# Patient Record
Sex: Female | Born: 1935 | Race: White | Hispanic: No | Marital: Married | Smoking: Former smoker
Health system: Southern US, Community
[De-identification: ages and names within clinical notes are randomized; demographics above are authoritative.]

## PROBLEM LIST (undated history)

## (undated) DIAGNOSIS — I509 Heart failure, unspecified: Secondary | ICD-10-CM

## (undated) DIAGNOSIS — J449 Chronic obstructive pulmonary disease, unspecified: Secondary | ICD-10-CM

## (undated) DIAGNOSIS — I2510 Atherosclerotic heart disease of native coronary artery without angina pectoris: Secondary | ICD-10-CM

## (undated) DIAGNOSIS — E785 Hyperlipidemia, unspecified: Secondary | ICD-10-CM

## (undated) DIAGNOSIS — I729 Aneurysm of unspecified site: Secondary | ICD-10-CM

## (undated) HISTORY — DX: Heart failure, unspecified: I50.9

## (undated) HISTORY — DX: Atherosclerotic heart disease of native coronary artery without angina pectoris: I25.10

## (undated) HISTORY — DX: Hyperlipidemia, unspecified: E78.5

## (undated) HISTORY — DX: Aneurysm of unspecified site: I72.9

## (undated) HISTORY — DX: Chronic obstructive pulmonary disease, unspecified: J44.9

---

## 2015-12-04 DIAGNOSIS — I714 Abdominal aortic aneurysm, without rupture, unspecified: Secondary | ICD-10-CM | POA: Insufficient documentation

## 2015-12-04 DIAGNOSIS — Z4502 Encounter for adjustment and management of automatic implantable cardiac defibrillator: Secondary | ICD-10-CM | POA: Insufficient documentation

## 2015-12-04 DIAGNOSIS — I2510 Atherosclerotic heart disease of native coronary artery without angina pectoris: Secondary | ICD-10-CM | POA: Insufficient documentation

## 2015-12-04 DIAGNOSIS — F17200 Nicotine dependence, unspecified, uncomplicated: Secondary | ICD-10-CM | POA: Insufficient documentation

## 2015-12-04 DIAGNOSIS — I255 Ischemic cardiomyopathy: Secondary | ICD-10-CM | POA: Insufficient documentation

## 2016-08-06 DIAGNOSIS — E785 Hyperlipidemia, unspecified: Secondary | ICD-10-CM | POA: Insufficient documentation

## 2016-08-06 DIAGNOSIS — T82330A Leakage of aortic (bifurcation) graft (replacement), initial encounter: Secondary | ICD-10-CM | POA: Insufficient documentation

## 2016-08-06 DIAGNOSIS — IMO0002 Reserved for concepts with insufficient information to code with codable children: Secondary | ICD-10-CM | POA: Insufficient documentation

## 2016-08-06 DIAGNOSIS — I10 Essential (primary) hypertension: Secondary | ICD-10-CM | POA: Insufficient documentation

## 2016-09-12 DIAGNOSIS — J449 Chronic obstructive pulmonary disease, unspecified: Secondary | ICD-10-CM

## 2016-09-12 DIAGNOSIS — E876 Hypokalemia: Secondary | ICD-10-CM

## 2016-09-12 DIAGNOSIS — J9601 Acute respiratory failure with hypoxia: Secondary | ICD-10-CM

## 2016-09-12 DIAGNOSIS — I10 Essential (primary) hypertension: Secondary | ICD-10-CM

## 2016-09-12 DIAGNOSIS — Z720 Tobacco use: Secondary | ICD-10-CM

## 2016-09-12 DIAGNOSIS — I5023 Acute on chronic systolic (congestive) heart failure: Secondary | ICD-10-CM

## 2016-09-13 DIAGNOSIS — E876 Hypokalemia: Secondary | ICD-10-CM | POA: Diagnosis not present

## 2016-09-13 DIAGNOSIS — J9601 Acute respiratory failure with hypoxia: Secondary | ICD-10-CM | POA: Diagnosis not present

## 2016-09-13 DIAGNOSIS — I5023 Acute on chronic systolic (congestive) heart failure: Secondary | ICD-10-CM | POA: Diagnosis not present

## 2016-09-13 DIAGNOSIS — J449 Chronic obstructive pulmonary disease, unspecified: Secondary | ICD-10-CM | POA: Diagnosis not present

## 2016-12-10 DIAGNOSIS — I361 Nonrheumatic tricuspid (valve) insufficiency: Secondary | ICD-10-CM

## 2016-12-10 DIAGNOSIS — I509 Heart failure, unspecified: Secondary | ICD-10-CM

## 2016-12-11 ENCOUNTER — Encounter: Payer: Self-pay | Admitting: Cardiology

## 2016-12-19 ENCOUNTER — Ambulatory Visit: Payer: Medicare PPO | Admitting: Cardiology

## 2016-12-23 ENCOUNTER — Encounter: Payer: Self-pay | Admitting: Cardiology

## 2016-12-23 ENCOUNTER — Ambulatory Visit (INDEPENDENT_AMBULATORY_CARE_PROVIDER_SITE_OTHER): Payer: Medicare PPO | Admitting: Cardiology

## 2016-12-23 VITALS — BP 108/68 | HR 72 | Resp 14 | Ht 67.0 in | Wt 122.0 lb

## 2016-12-23 DIAGNOSIS — I714 Abdominal aortic aneurysm, without rupture, unspecified: Secondary | ICD-10-CM

## 2016-12-23 DIAGNOSIS — F17200 Nicotine dependence, unspecified, uncomplicated: Secondary | ICD-10-CM

## 2016-12-23 DIAGNOSIS — I2510 Atherosclerotic heart disease of native coronary artery without angina pectoris: Secondary | ICD-10-CM

## 2016-12-23 DIAGNOSIS — J449 Chronic obstructive pulmonary disease, unspecified: Secondary | ICD-10-CM

## 2016-12-23 DIAGNOSIS — I255 Ischemic cardiomyopathy: Secondary | ICD-10-CM

## 2016-12-23 DIAGNOSIS — I10 Essential (primary) hypertension: Secondary | ICD-10-CM

## 2016-12-23 DIAGNOSIS — E782 Mixed hyperlipidemia: Secondary | ICD-10-CM

## 2016-12-23 NOTE — Patient Instructions (Signed)
Medication Instructions:  Your physician recommends that you continue on your current medications as directed. Please refer to the Current Medication list given to you today.  Labwork: Pro-BNP and BMP so we can see how your kidneys are doing and assess if you have any fluid build up.   Testing/Procedures: None   Follow-Up: Your physician recommends that you schedule a follow-up appointment in: 1 month with Dr. Bing MatterKrasowski   Any Other Special Instructions Will Be Listed Below (If Applicable).     If you need a refill on your cardiac medications before your next appointment, please call your pharmacy.

## 2016-12-23 NOTE — Progress Notes (Signed)
Cardiology Office Note:    Date:  12/23/2016   ID:  Yvonne BjornstadBarbara E Osei, DOB 09/04/1933, MRN [id number]  PCP:  Lonie Peakonroy, Nathan, PA-C  Cardiologist:  Gypsy Balsamobert Krasowski, MD    Referring MD: No ref. provider found   Chief Complaint  Patient presents with  . Hospitalization Follow-up  I'm short of breath tired and exhausted  History of Present Illness:    Yvonne Russell is a 83 y.o. female  with COPD as well as severe coronary myopathy. She complains of having weakness fatigue and tiredness. She is actually very frustrated. 6 months ago she said she was able to leave without oxygen and had some freedom and none she lost it all. Denies having any chest pain tightness squeezing pressure burning chest, she sleeps very well, there is no swelling of lower extremities. We had a long discussion about what to do with this situation and I will ask him to have proBNP to see if there is in the room for improvement with her CHF medications. I will also schedule her to see pulmonary to see if we can help her with her pulmonary status.  Past Medical History:  Diagnosis Date  . Aneurysm (HCC)   . COPD (chronic obstructive pulmonary disease) (HCC)   . Coronary artery disease   . Hyperlipidemia     History reviewed. No pertinent surgical history.  Current Medications: Current Meds  Medication Sig  . atorvastatin (LIPITOR) 10 MG tablet Take 1 tablet by mouth at bedtime.  . enalapril (VASOTEC) 2.5 MG tablet Take 2.5 mg by mouth 2 (two) times daily.   . furosemide (LASIX) 40 MG tablet Take 40 mg by mouth 2 (two) times daily.  . metoprolol succinate (TOPROL-XL) 25 MG 24 hr tablet Take 25 mg by mouth daily.  Marland Kitchen. omeprazole (PRILOSEC) 20 MG capsule Take 20 mg by mouth daily.  . rivaroxaban (XARELTO) 20 MG TABS tablet Take 20 mg by mouth daily with supper.  . Venlafaxine HCl 75 MG TB24 Take 1 tablet by mouth daily.     Allergies:   Patient has no known allergies.   Social History   Social History  .  Marital status: Married    Spouse name: N/A  . Number of children: N/A  . Years of education: N/A   Social History Main Topics  . Smoking status: Former Games developermoker  . Smokeless tobacco: Never Used  . Alcohol use No  . Drug use: No  . Sexual activity: Not Asked   Other Topics Concern  . None   Social History Narrative  . None     Family History: The patient's family history includes Heart failure in her mother. ROS:   Please see the history of present illness.    All 14 point review of systems negative except as described per history of present illness  EKGs/Labs/Other Studies Reviewed:      Recent Labs: No results found for requested labs within last 8760 hours.  Recent Lipid Panel No results found for: CHOL, TRIG, HDL, CHOLHDL, VLDL, LDLCALC, LDLDIRECT  Physical Exam:    VS:  BP 108/68   Pulse 72   Resp 14   Ht 5\' 7"  (1.702 m)   Wt 122 lb (55.3 kg) Comment: Reported, Wheel Chair  BMI 19.11 kg/m     Wt Readings from Last 3 Encounters:  12/23/16 122 lb (55.3 kg)     GEN: Cachectic-looking on the wheelchair with oxygen HEENT: Normal NECK: No JVD; No carotid bruits LYMPHATICS: No lymphadenopathy  CARDIAC: RRR, no murmurs, no rubs, no gallops RESPIRATORY:  Clear to auscultation without rales, wheezing or rhonchi poor entry bilaterally. ABDOMEN: Soft, non-tender, non-distended MUSCULOSKELETAL:  No edema; No deformity  SKIN: Warm and dry LOWER EXTREMITIES: no swelling NEUROLOGIC:  Alert and oriented x 3 PSYCHIATRIC:  Normal affect   ASSESSMENT:    1. Abdominal aortic aneurysm (AAA) without rupture (HCC)   2. Coronary artery disease involving native coronary artery of native heart without angina pectoris   3. Ischemic cardiomyopathy   4. Benign essential hypertension   5. Mixed hyperlipidemia   6. Smoking    PLAN:    In order of problems listed above:  1. Status post aortic aneurysm repair stable continue present management. 2. Coronary artery disease:  Stable we'll continue present management. 3. Ischemic cardiomyopathy: I will do proBNP to look for an opportunity to make her feel better. 4. Essential hypertension: Blood pressure appears to be well controlled for now we'll continue present management. 5. Dyslipidemia she is taking atorvastatin which I will continue. 6. Smoking: She tells me that she quit smoking.   Medication Adjustments/Labs and Tests Ordered: Current medicines are reviewed at length with the patient today.  Concerns regarding medicines are outlined above.  No orders of the defined types were placed in this encounter.  Medication changes: No orders of the defined types were placed in this encounter.   Signed, Georgeanna Leaobert J. Krasowski, MD, [facility] 12/23/2016 5:01 PM    [facility]

## 2016-12-24 LAB — BASIC METABOLIC PANEL
BUN / CREAT RATIO: 17 (ref 12–28)
BUN: 21 mg/dL (ref 8–27)
CALCIUM: 9.5 mg/dL (ref 8.7–10.3)
CHLORIDE: 95 mmol/L — AB (ref 96–106)
CO2: 31 mmol/L — AB (ref 20–29)
CREATININE: 1.25 mg/dL — AB (ref 0.57–1.00)
GFR, EST AFRICAN AMERICAN: 47 mL/min/{1.73_m2} — AB (ref >59)
GFR, EST NON AFRICAN AMERICAN: 40 mL/min/{1.73_m2} — AB (ref >59)
Glucose: 85 mg/dL (ref 65–99)
Potassium: 3.7 mmol/L (ref 3.5–5.2)
Sodium: 143 mmol/L (ref 134–144)

## 2016-12-24 LAB — PRO B NATRIURETIC PEPTIDE: NT-Pro BNP: 9305 pg/mL — ABNORMAL HIGH (ref 0–738)

## 2016-12-26 ENCOUNTER — Other Ambulatory Visit: Payer: Self-pay

## 2016-12-30 ENCOUNTER — Telehealth: Payer: Self-pay | Admitting: Cardiology

## 2016-12-30 NOTE — Telephone Encounter (Signed)
Pt advised another referral has been sent and gave her the number to the office of which she was referred.

## 2016-12-30 NOTE — Addendum Note (Signed)
Addended by: Rodney LangtonITTER, JADA M on: 12/30/2016 01:39 PM   Modules accepted: Orders

## 2016-12-30 NOTE — Telephone Encounter (Signed)
Was supposed to be referred to pulmonologist but still hasn't heard anything

## 2016-12-30 NOTE — Telephone Encounter (Signed)
Another referral has been sent to pulmonology this time to Dr. Carlena SaxAlvas office.

## 2017-01-23 ENCOUNTER — Ambulatory Visit: Payer: Medicare PPO | Admitting: Cardiology

## 2017-01-30 DIAGNOSIS — J449 Chronic obstructive pulmonary disease, unspecified: Secondary | ICD-10-CM

## 2017-01-30 DIAGNOSIS — E785 Hyperlipidemia, unspecified: Secondary | ICD-10-CM | POA: Diagnosis not present

## 2017-01-30 DIAGNOSIS — I10 Essential (primary) hypertension: Secondary | ICD-10-CM | POA: Diagnosis not present

## 2017-01-30 DIAGNOSIS — I509 Heart failure, unspecified: Secondary | ICD-10-CM | POA: Diagnosis not present

## 2017-01-30 DIAGNOSIS — J90 Pleural effusion, not elsewhere classified: Secondary | ICD-10-CM | POA: Diagnosis not present

## 2017-01-30 DIAGNOSIS — J189 Pneumonia, unspecified organism: Secondary | ICD-10-CM | POA: Diagnosis not present

## 2017-01-31 DIAGNOSIS — J90 Pleural effusion, not elsewhere classified: Secondary | ICD-10-CM | POA: Diagnosis not present

## 2017-01-31 DIAGNOSIS — J449 Chronic obstructive pulmonary disease, unspecified: Secondary | ICD-10-CM | POA: Diagnosis not present

## 2017-01-31 DIAGNOSIS — E785 Hyperlipidemia, unspecified: Secondary | ICD-10-CM | POA: Diagnosis not present

## 2017-01-31 DIAGNOSIS — J189 Pneumonia, unspecified organism: Secondary | ICD-10-CM | POA: Diagnosis not present

## 2017-01-31 DIAGNOSIS — I509 Heart failure, unspecified: Secondary | ICD-10-CM | POA: Diagnosis not present

## 2017-01-31 DIAGNOSIS — I10 Essential (primary) hypertension: Secondary | ICD-10-CM | POA: Diagnosis not present

## 2017-02-01 DIAGNOSIS — J90 Pleural effusion, not elsewhere classified: Secondary | ICD-10-CM | POA: Diagnosis not present

## 2017-02-01 DIAGNOSIS — J449 Chronic obstructive pulmonary disease, unspecified: Secondary | ICD-10-CM | POA: Diagnosis not present

## 2017-02-01 DIAGNOSIS — J189 Pneumonia, unspecified organism: Secondary | ICD-10-CM | POA: Diagnosis not present

## 2017-02-01 DIAGNOSIS — I509 Heart failure, unspecified: Secondary | ICD-10-CM | POA: Diagnosis not present

## 2017-02-02 DIAGNOSIS — J90 Pleural effusion, not elsewhere classified: Secondary | ICD-10-CM | POA: Diagnosis not present

## 2017-02-02 DIAGNOSIS — I509 Heart failure, unspecified: Secondary | ICD-10-CM | POA: Diagnosis not present

## 2017-02-02 DIAGNOSIS — J189 Pneumonia, unspecified organism: Secondary | ICD-10-CM | POA: Diagnosis not present

## 2017-02-02 DIAGNOSIS — J449 Chronic obstructive pulmonary disease, unspecified: Secondary | ICD-10-CM | POA: Diagnosis not present

## 2017-02-03 DIAGNOSIS — J189 Pneumonia, unspecified organism: Secondary | ICD-10-CM | POA: Diagnosis not present

## 2017-02-03 DIAGNOSIS — I509 Heart failure, unspecified: Secondary | ICD-10-CM | POA: Diagnosis not present

## 2017-02-03 DIAGNOSIS — J90 Pleural effusion, not elsewhere classified: Secondary | ICD-10-CM | POA: Diagnosis not present

## 2017-02-03 DIAGNOSIS — J449 Chronic obstructive pulmonary disease, unspecified: Secondary | ICD-10-CM | POA: Diagnosis not present

## 2017-02-04 DIAGNOSIS — J90 Pleural effusion, not elsewhere classified: Secondary | ICD-10-CM | POA: Diagnosis not present

## 2017-02-04 DIAGNOSIS — J449 Chronic obstructive pulmonary disease, unspecified: Secondary | ICD-10-CM | POA: Diagnosis not present

## 2017-02-04 DIAGNOSIS — I509 Heart failure, unspecified: Secondary | ICD-10-CM | POA: Diagnosis not present

## 2017-02-04 DIAGNOSIS — J189 Pneumonia, unspecified organism: Secondary | ICD-10-CM | POA: Diagnosis not present

## 2017-02-10 ENCOUNTER — Other Ambulatory Visit: Payer: Self-pay

## 2017-02-10 MED ORDER — FUROSEMIDE 40 MG PO TABS: 40.0000 mg | ORAL_TABLET | Freq: Two times a day (BID) | ORAL | 1 refills | Status: AC

## 2017-03-18 ENCOUNTER — Ambulatory Visit (INDEPENDENT_AMBULATORY_CARE_PROVIDER_SITE_OTHER): Payer: Medicare PPO | Admitting: Internal Medicine

## 2017-03-18 ENCOUNTER — Encounter: Payer: Self-pay | Admitting: Internal Medicine

## 2017-03-18 ENCOUNTER — Other Ambulatory Visit: Payer: Medicare PPO

## 2017-03-18 ENCOUNTER — Ambulatory Visit (INDEPENDENT_AMBULATORY_CARE_PROVIDER_SITE_OTHER)
Admission: RE | Admit: 2017-03-18 | Discharge: 2017-03-18 | Disposition: A | Discharge status: Home / Self Care | Payer: Medicare PPO | Source: Ambulatory Visit | Attending: Internal Medicine | Admitting: Internal Medicine

## 2017-03-18 VITALS — BP 110/70 | HR 109 | Ht 67.0 in | Wt 121.0 lb

## 2017-03-18 DIAGNOSIS — F17200 Nicotine dependence, unspecified, uncomplicated: Secondary | ICD-10-CM

## 2017-03-18 DIAGNOSIS — J449 Chronic obstructive pulmonary disease, unspecified: Secondary | ICD-10-CM

## 2017-03-18 DIAGNOSIS — J9611 Chronic respiratory failure with hypoxia: Secondary | ICD-10-CM | POA: Diagnosis not present

## 2017-03-18 DIAGNOSIS — J90 Pleural effusion, not elsewhere classified: Secondary | ICD-10-CM | POA: Diagnosis not present

## 2017-03-18 NOTE — Patient Instructions (Addendum)
ICD-10-CM   1. Smoking F17.200   2. Chronic obstructive pulmonary disease, unspecified COPD type (HCC) J44.9   3. Chronic respiratory failure with hypoxia (HCC) J96.11   4. Recurrent left pleural effusion J90     CXR 2 view - wil call with results to decide next step Do alpha 1 AT level for genetic cause of copd - will call with results You will need med calendar visit with APP at some point in future  Followup - based on results of cxr

## 2017-03-18 NOTE — Progress Notes (Signed)
Subjective:     Patient ID: Yvonne Russell, female   DOB: 08-03-1933, 83 y.o.   MRN: [id number]  PCP Yvonne Peak, PA-C  HPI  IOV 03/18/2017  Chief Complaint  Patient presents with  . Advice Only    pleural effusion per heartcare and nathan conroy, severe SOB walking to exam room    This 83 year old female with a past medical history of coronary artery disease,chronic systolic heart failure with ejection fraction 25% with a defibrillator chronic atrial fibrillation, abdominal aortic aneurysm status post repair, chronic tobacco use with COPD on 4 L oxygen not otherwise specified. She tells me and her granddaughter confirms this. It in the last 1 year she's had 4 admissions for respiratory exacerbations although [facility]. Starting April 2018 she started being on home oxygen for COPD. She did have a respiratory admission in early to mid September 2018. Visualization of the chest x-rays done at [facility] in September 2018 show that early in September 2018 she hadmoderate left pleural effusion that after thoracentesis is small bilateral left greater than right effusion. In review of the discharge summary at that time shows that the admission diagnosis was acute on chronic hypoxemic respiratory failure due to combination of pneumonia and acute on chronic systolic heart failure. She was treated with antibiotics and diuresis. She was discharged to a skilled nursing facility and there was a recommendation to follow up with pulmonary. Therefore she has been referred here. Chart review suggests that the effusions were transudate. She also tells me that the only thing that improved her was the thoracentesis. Stick 10 day stay at the skilled nursing facility didn't help herphysical conditioning a little bit. Overall she's not back to her baseline but she is functional. Echocardiogram done 12/09/2016 at [facility]: Reviewed the results and it showed global hypokinesis with ejection fraction  25% with biatrial enlargement and elevated right ventricular systolic pressure of pulmonary hypertension  Noted: she says she is on nebs/mdi for copd but med review does not show any other than albuterol  COPD CAT score   CAT COPD Symptom & Quality of Life Score (GSK trademark) 0 is no burden. 5 is highest burden 03/18/2017   Never Cough -> Cough all the time 2  No phlegm in chest -> Chest is full of phlegm 1  No chest tightness -> Chest feels very tight 1  No dyspnea for 1 flight stairs/hill -> Very dyspneic for 1 flight of stairs 5  No limitations for ADL at home -> Very limited with ADL at home 5  Confident leaving home -> Not at all confident leaving home 5  Sleep soundly -> Do not sleep soundly because of lung condition 4  Lots of Energy -> No energy at all 3  TOTAL Score (max 40)  26       Results for Yvonne, Russell (MRN [id number]) as of 03/18/2017 11:39  Ref. Range 12/23/2016 17:12  Creatinine Latest Ref Range: 0.57 - 1.00 mg/dL 7.82 (H)     has a past medical history of Aneurysm (HCC); CHF (congestive heart failure) (HCC); COPD (chronic obstructive pulmonary disease) (HCC); Coronary artery disease; and Hyperlipidemia.   reports that she quit smoking about 3 months ago. Her smoking use included Cigarettes. She has a 31.00 pack-year smoking history. She has never used smokeless tobacco.  History reviewed. No pertinent surgical history.  No Known Allergies  Immunization History  Administered Date(s) Administered  . Influenza, High Dose Seasonal PF 01/16/2017  . Pneumococcal Conjugate-13 03/19/2015  Family History  Problem Relation Age of Onset  . Heart failure Mother      Current Outpatient Prescriptions:  .  atorvastatin (LIPITOR) 10 MG tablet, Take 1 tablet by mouth at bedtime., Disp: , Rfl: 6 .  enalapril (VASOTEC) 2.5 MG tablet, Take 2.5 mg by mouth daily. , Disp: , Rfl:  .  furosemide (LASIX) 40 MG tablet, Take 1 tablet (40 mg total) by mouth 2 (two)  times daily., Disp: 180 tablet, Rfl: 1 .  metoprolol succinate (TOPROL-XL) 25 MG 24 hr tablet, Take 25 mg by mouth daily., Disp: , Rfl:  .  omeprazole (PRILOSEC) 20 MG capsule, Take 20 mg by mouth daily., Disp: , Rfl:  .  rivaroxaban (XARELTO) 20 MG TABS tablet, Take 20 mg by mouth daily with supper., Disp: , Rfl:     Review of Systems     Objective:   Physical Exam  Constitutional: She is oriented to person, place, and time. No distress.  lean  HENT:  Head: Normocephalic and atraumatic.  Right Ear: External ear normal.  Left Ear: External ear normal.  Mouth/Throat: Oropharynx is clear and moist. No oropharyngeal exudate.  Eyes: Pupils are equal, round, and reactive to light. Conjunctivae and EOM are normal. Right eye exhibits no discharge. Left eye exhibits no discharge. No scleral icterus.  Neck: Normal range of motion. Neck supple. No JVD present. No tracheal deviation present. No thyromegaly present.  Cardiovascular: Normal rate, regular rhythm, normal heart sounds and intact distal pulses.  Exam reveals no gallop and no friction rub.   No murmur heard. Pulmonary/Chest: Effort normal and breath sounds normal. No respiratory distress. She has no wheezes. She has no rales. She exhibits no tenderness.  o2 on Purse lip breathing Left base air entry diminished  Abdominal: Soft. Bowel sounds are normal. She exhibits no distension and no mass. There is no tenderness. There is no rebound and no guarding.  Musculoskeletal: Normal range of motion. She exhibits no edema or tenderness.  Very mild pedal edema only  Lymphadenopathy:    She has no cervical adenopathy.  Neurological: She is alert and oriented to person, place, and time. She has normal reflexes. No cranial nerve deficit. She exhibits normal muscle tone. Coordination normal.  Skin: Skin is warm and dry. No rash noted. She is not diaphoretic. No erythema. No pallor.  Psychiatric: She has a normal mood and affect. Her behavior is  normal. Judgment and thought content normal.  Vitals reviewed.   Vitals:   03/18/17 1121 03/18/17 1122 03/18/17 1123  BP:   110/70  Pulse:  (!) 109   SpO2:  97%   Weight: 121 lb (54.9 kg)    Height: 5\' 7"  (1.702 m)      Estimated body mass index is 18.95 kg/m as calculated from the following:   Height as of this encounter: 5\' 7"  (1.702 m).   Weight as of this encounter: 121 lb (54.9 kg).      Assessment:       ICD-10-CM   1. Smoking F17.200   2. Chronic obstructive pulmonary disease, unspecified COPD type (HCC) J44.9   3. Chronic respiratory failure with hypoxia (HCC) J96.11   4. Recurrent left pleural effusion J90        Plan:       CXR 2 view - wil call with results to decide next step Do alpha 1 AT level for genetic cause of copd - will call with results You will need med calendar visit with  APP at some point in future  Followup - based on results of cxr    Dr. Kalman Shan, M.D., [facility].C.P Pulmonary and Critical Care Medicine Staff Physician [facility] [place] Pulmonary and Critical Care Pager: [phone], If no answer or between  15:00h - 7:00h: call [phone]  03/18/2017 11:56 AM

## 2017-03-18 NOTE — Addendum Note (Signed)
Addended by: Wyvonne LenzPINION, Dequon Schnebly P on: 03/18/2017 11:59 AM   Modules accepted: Orders

## 2017-03-18 NOTE — Progress Notes (Signed)
   Subjective:    Patient ID: Yvonne BjornstadBarbara E Russell, female    DOB: 10/24/1933, 83 y.o.   MRN: [id number]  HPI    Review of Systems  HENT: Positive for sinus pressure, sneezing and sore throat.   Respiratory: Positive for cough and shortness of breath.   Allergic/Immunologic: Negative.   Hematological: Bruises/bleeds easily.  Psychiatric/Behavioral: Positive for dysphoric mood.       Objective:   Physical Exam        Assessment & Plan:

## 2017-03-22 LAB — ALPHA-1 ANTITRYPSIN PHENOTYPE: A1 ANTITRYPSIN SER: 224 mg/dL — AB (ref 83–199)

## 2017-03-24 ENCOUNTER — Telehealth: Payer: Self-pay | Admitting: Internal Medicine

## 2017-03-24 NOTE — Telephone Encounter (Signed)
Yvonne Russell, Murali, MD sent to Pinion, Farley LyEmily P, CMA        Alpha 1 Is MM   Spoke with the pt and notified of results of her labs  She is requesting her cxr results  Please advise, thanks

## 2017-03-26 NOTE — Telephone Encounter (Signed)
SMall bilteral effusions c/w chronic systolic chf  Plan - make appt with APP for med calendar and copd mgmt  Dr. Kalman ShanMurali Johni Narine, M.D., [facility].C.P Pulmonary and Critical Care Medicine Staff Physician [facility] [place] Pulmonary and Critical Care Pager: [phone], If no answer or between  15:00h - 7:00h: call [phone]  03/26/2017 2:53 PM        CLINICAL DATA:  Shortness of breath.  Follow-up pleural effusion .  EXAM: CHEST  2 VIEW  COMPARISON:  02/03/2017 .  FINDINGS: Cardiac pacer with lead tip over the right ventricle. Cardiomegaly with normal pulmonary vascularity. No focal alveolar infiltrate. Low lung volumes with basilar atelectasis. Stable bilateral pleural effusions.  IMPRESSION: 1.  Stable basilar atelectasis and bilateral pleural effusions.  2. Stable cardiomegaly.  Cardiac pacer noted stable position .   Electronically Signed   By: Maisie Fushomas  Register   On: 03/18/2017 13:24

## 2017-03-26 NOTE — Telephone Encounter (Signed)
Called and spoke with pt and she is aware of MR recs.  She is aware of appt with TP on 11/6 for med cal.

## 2017-03-27 DIAGNOSIS — J90 Pleural effusion, not elsewhere classified: Secondary | ICD-10-CM | POA: Diagnosis not present

## 2017-03-27 DIAGNOSIS — I4891 Unspecified atrial fibrillation: Secondary | ICD-10-CM

## 2017-03-27 DIAGNOSIS — I10 Essential (primary) hypertension: Secondary | ICD-10-CM | POA: Diagnosis not present

## 2017-03-27 DIAGNOSIS — J9601 Acute respiratory failure with hypoxia: Secondary | ICD-10-CM

## 2017-03-27 DIAGNOSIS — I5023 Acute on chronic systolic (congestive) heart failure: Secondary | ICD-10-CM | POA: Diagnosis not present

## 2017-03-28 DIAGNOSIS — J9601 Acute respiratory failure with hypoxia: Secondary | ICD-10-CM | POA: Diagnosis not present

## 2017-03-28 DIAGNOSIS — J90 Pleural effusion, not elsewhere classified: Secondary | ICD-10-CM | POA: Diagnosis not present

## 2017-03-28 DIAGNOSIS — I10 Essential (primary) hypertension: Secondary | ICD-10-CM | POA: Diagnosis not present

## 2017-03-28 DIAGNOSIS — I5023 Acute on chronic systolic (congestive) heart failure: Secondary | ICD-10-CM | POA: Diagnosis not present

## 2017-03-28 DIAGNOSIS — I4891 Unspecified atrial fibrillation: Secondary | ICD-10-CM | POA: Diagnosis not present

## 2017-03-29 DIAGNOSIS — I5023 Acute on chronic systolic (congestive) heart failure: Secondary | ICD-10-CM | POA: Diagnosis not present

## 2017-03-29 DIAGNOSIS — J90 Pleural effusion, not elsewhere classified: Secondary | ICD-10-CM | POA: Diagnosis not present

## 2017-03-29 DIAGNOSIS — I4891 Unspecified atrial fibrillation: Secondary | ICD-10-CM | POA: Diagnosis not present

## 2017-03-29 DIAGNOSIS — J9601 Acute respiratory failure with hypoxia: Secondary | ICD-10-CM | POA: Diagnosis not present

## 2017-03-30 DIAGNOSIS — I4891 Unspecified atrial fibrillation: Secondary | ICD-10-CM | POA: Diagnosis not present

## 2017-03-30 DIAGNOSIS — I5023 Acute on chronic systolic (congestive) heart failure: Secondary | ICD-10-CM | POA: Diagnosis not present

## 2017-03-30 DIAGNOSIS — J9601 Acute respiratory failure with hypoxia: Secondary | ICD-10-CM | POA: Diagnosis not present

## 2017-03-30 DIAGNOSIS — J90 Pleural effusion, not elsewhere classified: Secondary | ICD-10-CM | POA: Diagnosis not present

## 2017-04-01 ENCOUNTER — Encounter: Payer: Self-pay | Admitting: Adult Health

## 2017-04-01 ENCOUNTER — Ambulatory Visit: Payer: Medicare PPO | Admitting: Adult Health

## 2017-04-01 VITALS — BP 118/70 | HR 72 | Ht 67.0 in | Wt 112.8 lb

## 2017-04-01 DIAGNOSIS — F17200 Nicotine dependence, unspecified, uncomplicated: Secondary | ICD-10-CM | POA: Diagnosis not present

## 2017-04-01 DIAGNOSIS — J449 Chronic obstructive pulmonary disease, unspecified: Secondary | ICD-10-CM | POA: Diagnosis not present

## 2017-04-01 DIAGNOSIS — J9611 Chronic respiratory failure with hypoxia: Secondary | ICD-10-CM | POA: Diagnosis not present

## 2017-04-01 MED ORDER — UMECLIDINIUM-VILANTEROL 62.5-25 MCG/INH IN AEPB: 1.0000 | INHALATION_SPRAY | Freq: Every day | RESPIRATORY_TRACT | 0 refills | Status: AC

## 2017-04-01 NOTE — Progress Notes (Signed)
 @Patient  ID: Yvonne BjornstadBarbara E Russell, female    DOB: 05/08/1934, 83 y.o.   MRN: [id number]  Chief Complaint  Patient presents with  . Follow-up    Referring provider: Arlyss QueenConroy, Nathan, PA-C  HPI: 83 year old female former smoker 2018  seen for pulmonary consult March 18, 2017 for shortness of breath with underlying COPD on oxygen at 4 L.  She has frequent exacerbations for COPD. Patient has coronary artery disease, chronic systolic heart failure with an ejection fraction at 25%, chronic A. Fib  TEST [facility] moderate left effusion status post thoracentesis and pneumonia.  [facility] showed EF of 25% with biatrial enlargement and elevated right ventricular systolic pressure consistent with pulmonary hypertension.    score 26 March 21, 2017 Alpha-1 antitrypsin level normal-MM  04/01/2017 Follow up : COPD  Pt returns for 2 week follow up . She was seen for pulmonary consult last ov . She has COPD on oxygen 4l/m . [facility], pleural effusion s/p thoracentesis . Pt had CXR showed stable small pleural effusion .  alpha-1 testing was normal.  Patient has albuterol at home and says she has never been on any other inhalers for her COPD.  Today in the office spirometry showed very severe COPD with an FEV1 at 28%.  Ratio 44.  Patient has underlying congestive heart failure with echo in July showing EF of 25%. Patient says she gets short of breath with minimal activity.  Wears out easily.  Patient says her weight has been steady without any increased lower extremity swelling.  Patient has poor appetite.  Weight has been decreasing over the last year.. Patient is accompanied by her granddaughter.  Patient lives at home with her husband who also has COPD.  She denies any chest pain orthopnea PND or increased leg swelling.    No Known Allergies  Immunization History  Administered Date(s)  Administered  . Influenza, High Dose Seasonal PF 01/16/2017  . Pneumococcal Conjugate-13 03/19/2015    Past Medical History:  Diagnosis Date  . Aneurysm (HCC)   . CHF (congestive heart failure) (HCC)   . COPD (chronic obstructive pulmonary disease) (HCC)   . Coronary artery disease   . Hyperlipidemia     Tobacco History: Social History   Tobacco Use  Smoking Status Former Smoker  . Packs/day: 0.50  . Years: 62.00  . Pack years: 31.00  . Types: Cigarettes  . Last attempt to quit: 12/16/2016  . Years since quitting: 0.2  Smokeless Tobacco Never Used   Counseling given: Not Answered   Outpatient Encounter Medications as of 04/01/2017  Medication Sig  . albuterol (PROVENTIL) (2.5 MG/3ML) 0.083% nebulizer solution   . atorvastatin (LIPITOR) 10 MG tablet Take 1 tablet by mouth at bedtime.  . enalapril (VASOTEC) 2.5 MG tablet Take 2.5 mg by mouth daily.   . furosemide (LASIX) 40 MG tablet Take 1 tablet (40 mg total) by mouth 2 (two) times daily.  . metoprolol succinate (TOPROL-XL) 25 MG 24 hr tablet Take 25 mg by mouth daily.  Marland Kitchen. omeprazole (PRILOSEC) 20 MG capsule Take 20 mg by mouth daily.  . rivaroxaban (XARELTO) 20 MG TABS tablet Take 20 mg by mouth daily with supper.   No facility-administered encounter medications on file as of 04/01/2017.      Review of Systems  Constitutional:   No  weight loss, night sweats,  Fevers, chills, + fatigue, or  lassitude.  HEENT:   No headaches,  Difficulty swallowing,  Tooth/dental problems, or  Sore throat,                No sneezing, itching, ear ache, nasal congestion, post nasal drip,   CV:  No chest pain,  Orthopnea, PND, swelling in lower extremities, anasarca, dizziness, palpitations, syncope.   GI  No heartburn, indigestion, abdominal pain, nausea, vomiting, diarrhea, change in bowel habits, loss of appetite, bloody stools.   Resp: .  No chest wall deformity  Skin: no rash or lesions.  GU: no dysuria, change in color of  urine, no urgency or frequency.  No flank pain, no hematuria   MS:  No joint pain or swelling.  No decreased range of motion.  No back pain.    Physical Exam  BP 118/70 (BP Location: Left Arm, Cuff Size: Normal)   Pulse 72   Ht 5\' 7"  (1.702 m)   Wt 112 lb 12.8 oz (51.2 kg)   SpO2 98%   BMI 17.67 kg/m   GEN: A/Ox3; pleasant , NAD, thin and frail on oxygen   HEENT:  /AT,  EACs-clear, TMs-wnl, NOSE-clear, THROAT-clear, no lesions, no postnasal drip or exudate noted.   NECK:  Supple w/ fair ROM; no JVD; normal carotid impulses w/o bruits; no thyromegaly or nodules palpated; no lymphadenopathy.    RESP decreased breath sounds in the bases . no accessory muscle use, no dullness to percussion  CARD:  RRR, no m/r/g, no peripheral edema, pulses intact, no cyanosis or clubbing.  GI:   Soft & nt; nml bowel sounds; no organomegaly or masses detected.   Musco: Warm bil, no deformities or joint swelling noted.   Neuro: alert, no focal deficits noted.    Skin: Warm, no lesions or rashes    Lab Results:  CBC No results found for: WBC, RBC, HGB, HCT, PLT, MCV, MCH, MCHC, RDW, LYMPHSABS, MONOABS, EOSABS, BASOSABS  BMET   BNP No results found for: BNP  Imaging: Dg Chest 2 View  Result Date: 03/18/2017 CLINICAL DATA:  Shortness of breath.  Follow-up pleural effusion . EXAM: CHEST  2 VIEW COMPARISON:  02/03/2017 . FINDINGS: Cardiac pacer with lead tip over the right ventricle. Cardiomegaly with normal pulmonary vascularity. No focal alveolar infiltrate. Low lung volumes with basilar atelectasis. Stable bilateral pleural effusions. IMPRESSION: 1.  Stable basilar atelectasis and bilateral pleural effusions. 2. Stable cardiomegaly.  Cardiac pacer noted stable position . Electronically Signed   By: Maisie Fushomas  Register   On: 03/18/2017 13:24     Assessment & Plan:   No problem-specific Assessment & Plan notes found for this encounter.     Yvonne Oaksammy Tawnee Clegg, NP 04/01/2017

## 2017-04-01 NOTE — Assessment & Plan Note (Signed)
Very severe COPD that is oxygen dependent.  Patient is no longer smoking.  Trial of ANORO .  On return consider referral for pulmonary rehab  Plan  Patient Instructions  Begin ANORO 1 puff daily .  Continue on Oxygen 4l/m.  Try ensure between meals.  Follow up with Dr. Marchelle Gearingamaswamy in 6 -8 weeks and As needed

## 2017-04-01 NOTE — Assessment & Plan Note (Signed)
Continue on oxygen 

## 2017-04-01 NOTE — Patient Instructions (Addendum)
Begin ANORO 1 puff daily .  Continue on Oxygen 4l/m.  Try ensure between meals.  Follow up with Dr. Marchelle Gearingamaswamy in 6 -8 weeks and As needed

## 2017-04-04 DIAGNOSIS — J449 Chronic obstructive pulmonary disease, unspecified: Secondary | ICD-10-CM | POA: Diagnosis not present

## 2017-04-04 DIAGNOSIS — I4891 Unspecified atrial fibrillation: Secondary | ICD-10-CM | POA: Diagnosis not present

## 2017-04-04 DIAGNOSIS — I2510 Atherosclerotic heart disease of native coronary artery without angina pectoris: Secondary | ICD-10-CM | POA: Diagnosis not present

## 2017-04-04 DIAGNOSIS — R5381 Other malaise: Secondary | ICD-10-CM | POA: Diagnosis not present

## 2017-04-04 DIAGNOSIS — I10 Essential (primary) hypertension: Secondary | ICD-10-CM | POA: Diagnosis not present

## 2017-04-04 DIAGNOSIS — I5023 Acute on chronic systolic (congestive) heart failure: Secondary | ICD-10-CM

## 2017-04-04 DIAGNOSIS — E876 Hypokalemia: Secondary | ICD-10-CM | POA: Diagnosis not present

## 2017-04-05 DIAGNOSIS — I5023 Acute on chronic systolic (congestive) heart failure: Secondary | ICD-10-CM | POA: Diagnosis not present

## 2017-04-05 DIAGNOSIS — R0602 Shortness of breath: Secondary | ICD-10-CM | POA: Diagnosis not present

## 2017-04-05 DIAGNOSIS — E876 Hypokalemia: Secondary | ICD-10-CM | POA: Diagnosis not present

## 2017-04-05 DIAGNOSIS — I4891 Unspecified atrial fibrillation: Secondary | ICD-10-CM | POA: Diagnosis not present

## 2017-04-05 DIAGNOSIS — J449 Chronic obstructive pulmonary disease, unspecified: Secondary | ICD-10-CM | POA: Diagnosis not present

## 2017-04-05 DIAGNOSIS — I2510 Atherosclerotic heart disease of native coronary artery without angina pectoris: Secondary | ICD-10-CM | POA: Diagnosis not present

## 2017-04-05 DIAGNOSIS — I10 Essential (primary) hypertension: Secondary | ICD-10-CM | POA: Diagnosis not present

## 2017-04-05 DIAGNOSIS — R5381 Other malaise: Secondary | ICD-10-CM | POA: Diagnosis not present

## 2017-04-06 DIAGNOSIS — J449 Chronic obstructive pulmonary disease, unspecified: Secondary | ICD-10-CM | POA: Diagnosis not present

## 2017-04-06 DIAGNOSIS — I4891 Unspecified atrial fibrillation: Secondary | ICD-10-CM | POA: Diagnosis not present

## 2017-04-06 DIAGNOSIS — R5381 Other malaise: Secondary | ICD-10-CM | POA: Diagnosis not present

## 2017-04-06 DIAGNOSIS — I5023 Acute on chronic systolic (congestive) heart failure: Secondary | ICD-10-CM | POA: Diagnosis not present

## 2017-04-07 DIAGNOSIS — I5023 Acute on chronic systolic (congestive) heart failure: Secondary | ICD-10-CM | POA: Diagnosis not present

## 2017-04-07 DIAGNOSIS — R5381 Other malaise: Secondary | ICD-10-CM | POA: Diagnosis not present

## 2017-04-07 DIAGNOSIS — I4891 Unspecified atrial fibrillation: Secondary | ICD-10-CM | POA: Diagnosis not present

## 2017-04-07 DIAGNOSIS — J449 Chronic obstructive pulmonary disease, unspecified: Secondary | ICD-10-CM | POA: Diagnosis not present

## 2017-04-08 DIAGNOSIS — J449 Chronic obstructive pulmonary disease, unspecified: Secondary | ICD-10-CM | POA: Diagnosis not present

## 2017-04-08 DIAGNOSIS — R5381 Other malaise: Secondary | ICD-10-CM | POA: Diagnosis not present

## 2017-04-08 DIAGNOSIS — I5023 Acute on chronic systolic (congestive) heart failure: Secondary | ICD-10-CM | POA: Diagnosis not present

## 2017-04-08 DIAGNOSIS — I4891 Unspecified atrial fibrillation: Secondary | ICD-10-CM | POA: Diagnosis not present

## 2017-04-09 DIAGNOSIS — R5381 Other malaise: Secondary | ICD-10-CM | POA: Diagnosis not present

## 2017-04-09 DIAGNOSIS — I5023 Acute on chronic systolic (congestive) heart failure: Secondary | ICD-10-CM | POA: Diagnosis not present

## 2017-04-09 DIAGNOSIS — I2510 Atherosclerotic heart disease of native coronary artery without angina pectoris: Secondary | ICD-10-CM | POA: Diagnosis not present

## 2017-04-09 DIAGNOSIS — E876 Hypokalemia: Secondary | ICD-10-CM | POA: Diagnosis not present

## 2017-04-09 DIAGNOSIS — I4891 Unspecified atrial fibrillation: Secondary | ICD-10-CM | POA: Diagnosis not present

## 2017-04-09 DIAGNOSIS — J449 Chronic obstructive pulmonary disease, unspecified: Secondary | ICD-10-CM | POA: Diagnosis not present

## 2017-04-09 DIAGNOSIS — I10 Essential (primary) hypertension: Secondary | ICD-10-CM | POA: Diagnosis not present

## 2017-04-10 ENCOUNTER — Telehealth: Payer: Self-pay | Admitting: Internal Medicine

## 2017-04-10 DIAGNOSIS — R918 Other nonspecific abnormal finding of lung field: Secondary | ICD-10-CM

## 2017-04-10 DIAGNOSIS — J449 Chronic obstructive pulmonary disease, unspecified: Secondary | ICD-10-CM | POA: Diagnosis not present

## 2017-04-10 DIAGNOSIS — I5023 Acute on chronic systolic (congestive) heart failure: Secondary | ICD-10-CM | POA: Diagnosis not present

## 2017-04-10 DIAGNOSIS — R5381 Other malaise: Secondary | ICD-10-CM | POA: Diagnosis not present

## 2017-04-10 DIAGNOSIS — I4891 Unspecified atrial fibrillation: Secondary | ICD-10-CM | POA: Diagnosis not present

## 2017-04-10 NOTE — Telephone Encounter (Signed)
Tried to call pt and when thought call was going to go through, line went dead. Will try to call back later.

## 2017-04-10 NOTE — Telephone Encounter (Signed)
  [facility]  Hospitalist DR Lucrezia StarchGhimre called stating [place] CT 04/04/17 chest shows left hilar mass.   Plan  - next few weeks do PET scan and arrange OPD fu   Dr. Kalman ShanMurali Gabryella Murfin, M.D., [facility].C.P Pulmonary and Critical Care Medicine Staff Physician [facility] [place] Pulmonary and Critical Care Pager: [phone], If no answer or between  15:00h - 7:00h: call [phone]  04/10/2017 10:56 AM

## 2017-04-14 ENCOUNTER — Inpatient Hospital Stay: Payer: Medicare PPO | Admitting: Adult Health

## 2017-04-15 ENCOUNTER — Other Ambulatory Visit: Payer: Self-pay | Admitting: *Deleted

## 2017-04-18 NOTE — Telephone Encounter (Signed)
I had to LVM for the patient about her appt for the PET Scan [facility]/07/2017 @ 9:00am arrival time 8:30am No food or drink 6 hours prior to scan

## 2017-04-18 NOTE — Telephone Encounter (Signed)
Pt called and husband answered. Husband given recommendations to further evaluate CT scan with a PET scan. Notified him to listen out for a phone call to schedule PET scan.   [facility] please [facility] when pt PET scan is so we can do follow up with MR.

## 2017-04-21 NOTE — Telephone Encounter (Signed)
[facility] but if only [place] that is fine as long as we can pull that image in our PACS. IF there are delays wuith [facility], then stick with [facility] PET 05/07/17  Dr. Kalman ShanMurali Jazmon Kos, M.D., [facility]C.P Pulmonary and Critical Care Medicine Staff Physician, [facility] Center Director - Interstitial Lung Disease  Program  Pulmonary Fibrosis [facility] at [facility] [place], [place], [id number]  Pager: [phone], If no answer or between  15:00h - 7:00h: call [phone] Telephone: [phone]

## 2017-04-21 NOTE — Telephone Encounter (Signed)
Spoke with Synetta Failnita, [facility] and she stated to me that pt stated that due to transportation issues, [facility]  Will keep the scheduled date of 05/07/17 for pt's PET scan and will try to arrange to have the CD brought to us.  Nothing further needed.

## 2017-05-02 ENCOUNTER — Other Ambulatory Visit: Payer: Self-pay | Admitting: Pharmacist

## 2017-05-02 ENCOUNTER — Encounter (HOSPITAL_COMMUNITY): Payer: Medicare PPO

## 2017-05-02 NOTE — Patient Outreach (Signed)
Outreach call to Yvonne BjornstadBarbara E Russell regarding her request for follow up from the [facility].  Yvonne Russell reports that she is currently out of her atorvastatin. Reports that she has been out of it for about 4 days. Reports that [facility], but that [facility] [place] when she needs to get something sooner. Patient reports that she is not concerned about missing doses of this medication. Counsel patient about the importance of adherence to this medication. Patient states that "you can help if you want". Offer to call patient's provider to request that he call a [facility]ill [facility]. Patient, again, says that I can if I want. Patient hangs up.  PLAN  Will call patient's PCP to request that he call a refill of her atorvastatin into her local pharmacy so that she can have this medication as soon as possible.  Yvonne Russell, PharmD, [facility]ACP Clinical Pharmacist [facility] Care Management [phone]

## 2017-05-02 NOTE — Patient Outreach (Signed)
Call patient's PCP office and speak with Pandora. Leave a message for patient's PCP to let provider know that she is currently out of her atorvastatin and request that he call a refill [facility]. If have not heard back from the office by next week, will call to follow up again at that time.  Duanne MoronElisabeth Walida Cajas, PharmD, [facility]ACP Clinical Pharmacist [facility] [phone]

## 2017-05-07 ENCOUNTER — Telehealth: Payer: Self-pay | Admitting: Cardiology

## 2017-05-07 ENCOUNTER — Other Ambulatory Visit: Payer: Self-pay | Admitting: Pharmacist

## 2017-05-07 ENCOUNTER — Other Ambulatory Visit: Payer: Self-pay

## 2017-05-07 MED ORDER — ATORVASTATIN CALCIUM 10 MG PO TABS: 10.0000 mg | ORAL_TABLET | Freq: Every day | ORAL | 1 refills | Status: AC

## 2017-05-07 MED ORDER — ATORVASTATIN CALCIUM 10 MG PO TABS: 10.0000 mg | ORAL_TABLET | Freq: Every day | ORAL | 1 refills | Status: DC

## 2017-05-07 NOTE — Telephone Encounter (Signed)
Sent to both pharmacies

## 2017-05-07 NOTE — Patient Outreach (Addendum)
Receive a call back from patient's PCP office letting me know that the patient's Cardiologist, Dr. Bing MatterKrasowski, last prescribed the patient's atorvastatin and advising that I contact his office.   Call Dr. Vanetta ShawlKrasowski's office and leave a message with Hansel Starlingdrienne in the office to let provider know that she is currently out of her atorvastatin. Hansel Starlingdrienne reviews the patient's chart and notes that the prescription was last written in April and should be out of refills. Request that her provider [facility].   Duanne MoronElisabeth Uva Runkel, PharmD, [facility]ACP Clinical Pharmacist [facility] [phone]

## 2017-05-07 NOTE — Telephone Encounter (Signed)
[facility] [place]

## 2017-05-11 DIAGNOSIS — I4891 Unspecified atrial fibrillation: Secondary | ICD-10-CM

## 2017-05-11 DIAGNOSIS — I10 Essential (primary) hypertension: Secondary | ICD-10-CM | POA: Diagnosis not present

## 2017-05-11 DIAGNOSIS — J449 Chronic obstructive pulmonary disease, unspecified: Secondary | ICD-10-CM

## 2017-05-11 DIAGNOSIS — J9601 Acute respiratory failure with hypoxia: Secondary | ICD-10-CM

## 2017-05-11 DIAGNOSIS — J441 Chronic obstructive pulmonary disease with (acute) exacerbation: Secondary | ICD-10-CM

## 2017-05-11 DIAGNOSIS — I5023 Acute on chronic systolic (congestive) heart failure: Secondary | ICD-10-CM | POA: Diagnosis not present

## 2017-05-12 ENCOUNTER — Ambulatory Visit: Payer: Medicare PPO | Admitting: Internal Medicine

## 2017-05-12 DIAGNOSIS — I4891 Unspecified atrial fibrillation: Secondary | ICD-10-CM | POA: Diagnosis not present

## 2017-05-12 DIAGNOSIS — J441 Chronic obstructive pulmonary disease with (acute) exacerbation: Secondary | ICD-10-CM | POA: Diagnosis not present

## 2017-05-12 DIAGNOSIS — J9601 Acute respiratory failure with hypoxia: Secondary | ICD-10-CM | POA: Diagnosis not present

## 2017-05-12 DIAGNOSIS — I5023 Acute on chronic systolic (congestive) heart failure: Secondary | ICD-10-CM | POA: Diagnosis not present

## 2017-05-12 DIAGNOSIS — J449 Chronic obstructive pulmonary disease, unspecified: Secondary | ICD-10-CM | POA: Diagnosis not present

## 2017-05-12 DIAGNOSIS — I10 Essential (primary) hypertension: Secondary | ICD-10-CM | POA: Diagnosis not present

## 2017-05-27 DEATH — deceased

## 2018-11-05 IMAGING — DX DG CHEST 2V
2 series · 2 of 2 positions shown · non-contrast
Comparison: 02/03/2017 .

CLINICAL DATA: Shortness of breath.  Follow-up pleural effusion .

EXAM:
CHEST  2 VIEW

[chest pa]
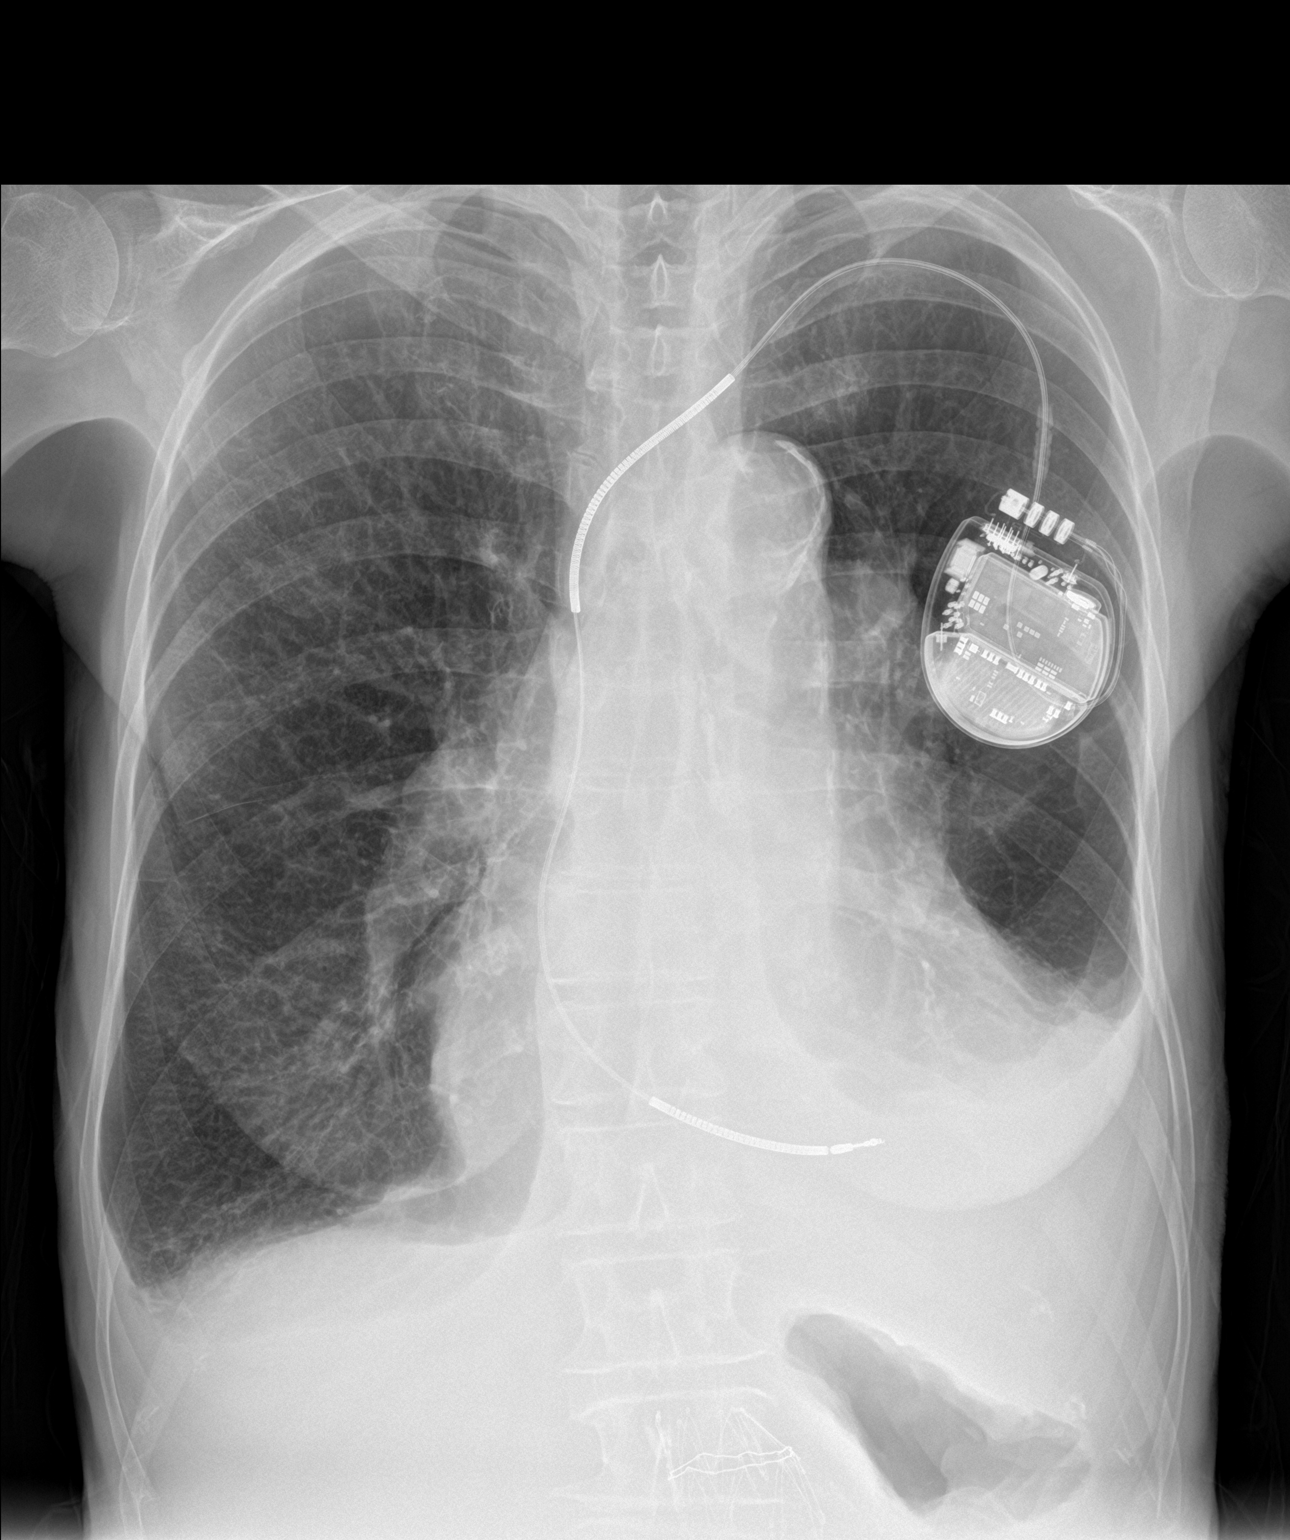

[chest lat]
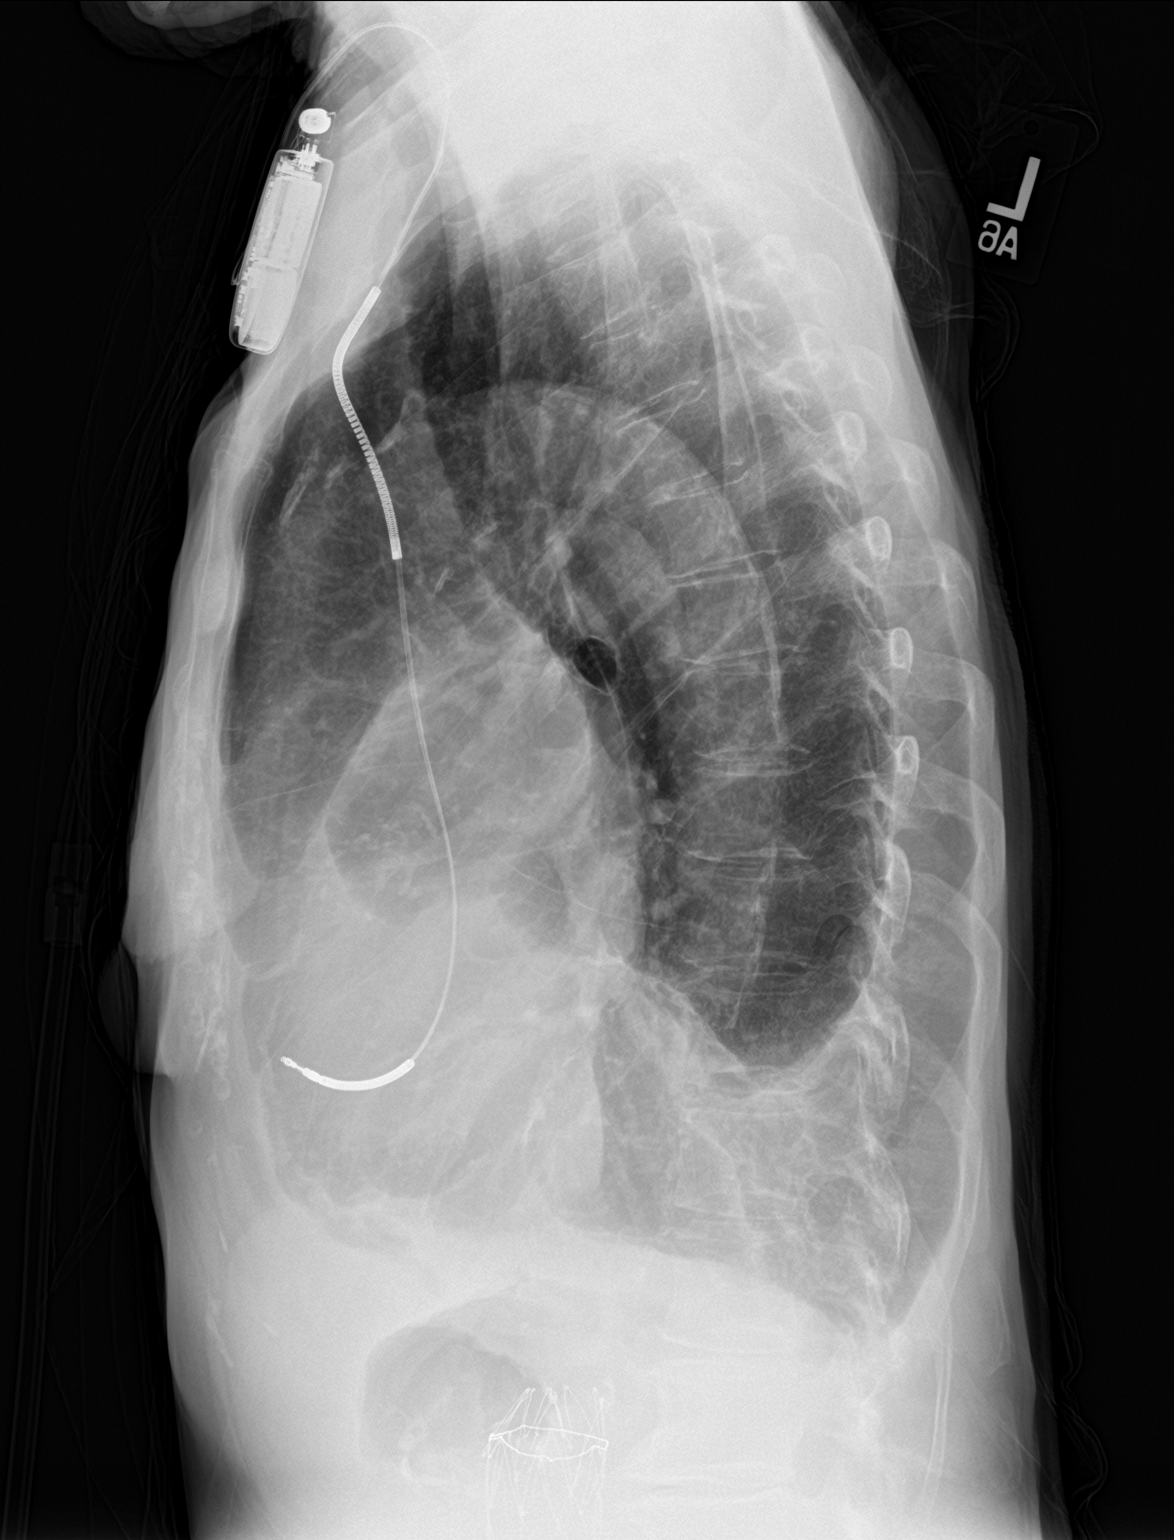

[2 of 2 positions shown; findings below may reference images not displayed]

FINDINGS: Cardiac pacer with lead tip over the right ventricle. Cardiomegaly
with normal pulmonary vascularity. No focal alveolar infiltrate. Low
lung volumes with basilar atelectasis. Stable bilateral pleural
effusions.
IMPRESSION: 1.  Stable basilar atelectasis and bilateral pleural effusions.

2. Stable cardiomegaly.  Cardiac pacer noted stable position .
